# Patient Record
Sex: Female | Born: 1970 | Race: Black or African American | Hispanic: No | Smoking: Never smoker
Health system: Southern US, Community
[De-identification: ages and names within clinical notes are randomized; demographics above are authoritative.]

## PROBLEM LIST (undated history)

## (undated) DIAGNOSIS — D219 Benign neoplasm of connective and other soft tissue, unspecified: Secondary | ICD-10-CM

## (undated) DIAGNOSIS — D62 Acute posthemorrhagic anemia: Secondary | ICD-10-CM

## (undated) DIAGNOSIS — D509 Iron deficiency anemia, unspecified: Secondary | ICD-10-CM

## (undated) DIAGNOSIS — N938 Other specified abnormal uterine and vaginal bleeding: Secondary | ICD-10-CM

---

## 2020-11-16 ENCOUNTER — Encounter (HOSPITAL_COMMUNITY): Payer: Self-pay

## 2020-11-16 ENCOUNTER — Other Ambulatory Visit: Payer: Self-pay

## 2020-11-16 ENCOUNTER — Inpatient Hospital Stay (HOSPITAL_COMMUNITY)
Admission: EM | Admit: 2020-11-16 | Discharge: 2020-11-18 | DRG: 760 | Disposition: A | Discharge status: Home / Self Care | Attending: Internal Medicine | Admitting: Internal Medicine

## 2020-11-16 DIAGNOSIS — Z20822 Contact with and (suspected) exposure to covid-19: Secondary | ICD-10-CM | POA: Diagnosis not present

## 2020-11-16 DIAGNOSIS — R1900 Intra-abdominal and pelvic swelling, mass and lump, unspecified site: Secondary | ICD-10-CM

## 2020-11-16 DIAGNOSIS — D62 Acute posthemorrhagic anemia: Secondary | ICD-10-CM | POA: Diagnosis present

## 2020-11-16 DIAGNOSIS — R112 Nausea with vomiting, unspecified: Secondary | ICD-10-CM

## 2020-11-16 DIAGNOSIS — R42 Dizziness and giddiness: Secondary | ICD-10-CM | POA: Diagnosis present

## 2020-11-16 DIAGNOSIS — N920 Excessive and frequent menstruation with regular cycle: Secondary | ICD-10-CM | POA: Diagnosis present

## 2020-11-16 DIAGNOSIS — N938 Other specified abnormal uterine and vaginal bleeding: Secondary | ICD-10-CM | POA: Diagnosis not present

## 2020-11-16 DIAGNOSIS — N83291 Other ovarian cyst, right side: Secondary | ICD-10-CM | POA: Diagnosis not present

## 2020-11-16 DIAGNOSIS — Z885 Allergy status to narcotic agent status: Secondary | ICD-10-CM | POA: Diagnosis not present

## 2020-11-16 DIAGNOSIS — I959 Hypotension, unspecified: Secondary | ICD-10-CM | POA: Diagnosis not present

## 2020-11-16 DIAGNOSIS — D219 Benign neoplasm of connective and other soft tissue, unspecified: Secondary | ICD-10-CM | POA: Diagnosis present

## 2020-11-16 DIAGNOSIS — Z531 Procedure and treatment not carried out because of patient's decision for reasons of belief and group pressure: Secondary | ICD-10-CM

## 2020-11-16 DIAGNOSIS — Z886 Allergy status to analgesic agent status: Secondary | ICD-10-CM | POA: Diagnosis not present

## 2020-11-16 DIAGNOSIS — D259 Leiomyoma of uterus, unspecified: Secondary | ICD-10-CM | POA: Diagnosis not present

## 2020-11-16 DIAGNOSIS — N939 Abnormal uterine and vaginal bleeding, unspecified: Secondary | ICD-10-CM

## 2020-11-16 HISTORY — DX: Acute posthemorrhagic anemia: D62

## 2020-11-16 HISTORY — DX: Benign neoplasm of connective and other soft tissue, unspecified: D21.9

## 2020-11-16 HISTORY — DX: Other specified abnormal uterine and vaginal bleeding: N93.8

## 2020-11-16 HISTORY — DX: Iron deficiency anemia, unspecified: D50.9

## 2020-11-16 NOTE — ED Triage Notes (Signed)
Pt BIB EMS. Pt was diagnosed wit uterine fibroids a couple of months ago. Pt is having heavy bleeding and feeling weak.

## 2020-11-16 NOTE — ED Notes (Signed)
Blood draw attempted x 2 without success

## 2020-11-16 NOTE — ED Notes (Signed)
Pt emesis episode x3 while In consult room while staff attempted to draw blood.

## 2020-11-17 ENCOUNTER — Emergency Department (HOSPITAL_COMMUNITY)

## 2020-11-17 ENCOUNTER — Encounter (HOSPITAL_COMMUNITY): Payer: Self-pay | Admitting: Emergency Medicine

## 2020-11-17 ENCOUNTER — Other Ambulatory Visit: Payer: Self-pay | Admitting: Obstetrics and Gynecology

## 2020-11-17 DIAGNOSIS — N83291 Other ovarian cyst, right side: Secondary | ICD-10-CM | POA: Diagnosis present

## 2020-11-17 DIAGNOSIS — I959 Hypotension, unspecified: Secondary | ICD-10-CM

## 2020-11-17 DIAGNOSIS — N920 Excessive and frequent menstruation with regular cycle: Secondary | ICD-10-CM | POA: Diagnosis present

## 2020-11-17 DIAGNOSIS — N938 Other specified abnormal uterine and vaginal bleeding: Secondary | ICD-10-CM | POA: Diagnosis present

## 2020-11-17 DIAGNOSIS — Z885 Allergy status to narcotic agent status: Secondary | ICD-10-CM | POA: Diagnosis not present

## 2020-11-17 DIAGNOSIS — D219 Benign neoplasm of connective and other soft tissue, unspecified: Secondary | ICD-10-CM | POA: Diagnosis not present

## 2020-11-17 DIAGNOSIS — R112 Nausea with vomiting, unspecified: Secondary | ICD-10-CM

## 2020-11-17 DIAGNOSIS — Z531 Procedure and treatment not carried out because of patient's decision for reasons of belief and group pressure: Secondary | ICD-10-CM | POA: Diagnosis present

## 2020-11-17 DIAGNOSIS — D62 Acute posthemorrhagic anemia: Secondary | ICD-10-CM | POA: Diagnosis not present

## 2020-11-17 DIAGNOSIS — IMO0001 Reserved for inherently not codable concepts without codable children: Secondary | ICD-10-CM

## 2020-11-17 DIAGNOSIS — N939 Abnormal uterine and vaginal bleeding, unspecified: Secondary | ICD-10-CM | POA: Diagnosis not present

## 2020-11-17 DIAGNOSIS — Z20822 Contact with and (suspected) exposure to covid-19: Secondary | ICD-10-CM | POA: Diagnosis present

## 2020-11-17 DIAGNOSIS — R42 Dizziness and giddiness: Secondary | ICD-10-CM | POA: Diagnosis present

## 2020-11-17 DIAGNOSIS — Z886 Allergy status to analgesic agent status: Secondary | ICD-10-CM | POA: Diagnosis not present

## 2020-11-17 DIAGNOSIS — D259 Leiomyoma of uterus, unspecified: Secondary | ICD-10-CM | POA: Diagnosis present

## 2020-11-17 LAB — COMPREHENSIVE METABOLIC PANEL
ALT: 12 U/L (ref 0–44)
AST: 15 U/L (ref 15–41)
Albumin: 3.4 g/dL — ABNORMAL LOW (ref 3.5–5.0)
Alkaline Phosphatase: 35 U/L — ABNORMAL LOW (ref 38–126)
Anion gap: 8 (ref 5–15)
BUN: 9 mg/dL (ref 6–20)
CO2: 24 mmol/L (ref 22–32)
Calcium: 8 mg/dL — ABNORMAL LOW (ref 8.9–10.3)
Chloride: 107 mmol/L (ref 98–111)
Creatinine, Ser: 0.82 mg/dL (ref 0.44–1.00)
GFR, Estimated: >60 mL/min (ref >60)
Glucose, Bld: 124 mg/dL — ABNORMAL HIGH (ref 70–99)
Potassium: 3.7 mmol/L (ref 3.5–5.1)
Sodium: 139 mmol/L (ref 135–145)
Total Bilirubin: 0.4 mg/dL (ref 0.3–1.2)
Total Protein: 6.4 g/dL — ABNORMAL LOW (ref 6.5–8.1)

## 2020-11-17 LAB — CBC WITH DIFFERENTIAL/PLATELET
Abs Immature Granulocytes: 0.08 10*3/uL — ABNORMAL HIGH (ref 0.00–0.07)
Basophils Absolute: 0.1 10*3/uL (ref 0.0–0.1)
Basophils Relative: 0 %
Eosinophils Absolute: 0 10*3/uL (ref 0.0–0.5)
Eosinophils Relative: 0 %
HCT: 28.1 % — ABNORMAL LOW (ref 36.0–46.0)
Hemoglobin: 8.8 g/dL — ABNORMAL LOW (ref 12.0–15.0)
Immature Granulocytes: 1 %
Lymphocytes Relative: 9 %
Lymphs Abs: 1 10*3/uL (ref 0.7–4.0)
MCH: 30.6 pg (ref 26.0–34.0)
MCHC: 31.3 g/dL (ref 30.0–36.0)
MCV: 97.6 fL (ref 80.0–100.0)
Monocytes Absolute: 0.5 10*3/uL (ref 0.1–1.0)
Monocytes Relative: 4 %
Neutro Abs: 10.1 10*3/uL — ABNORMAL HIGH (ref 1.7–7.7)
Neutrophils Relative %: 86 %
Platelets: 347 10*3/uL (ref 150–400)
RBC: 2.88 MIL/uL — ABNORMAL LOW (ref 3.87–5.11)
RDW: 12.8 % (ref 11.5–15.5)
WBC: 11.7 10*3/uL — ABNORMAL HIGH (ref 4.0–10.5)
nRBC: 0 % (ref 0.0–0.2)

## 2020-11-17 LAB — TYPE AND SCREEN
ABO/RH(D): B POS
Antibody Screen: NEGATIVE

## 2020-11-17 LAB — RETICULOCYTES
Immature Retic Fract: 31.7 % — ABNORMAL HIGH (ref 2.3–15.9)
RBC.: 2.2 MIL/uL — ABNORMAL LOW (ref 3.87–5.11)
Retic Count, Absolute: 43.1 10*3/uL (ref 19.0–186.0)
Retic Ct Pct: 2 % (ref 0.4–3.1)

## 2020-11-17 LAB — IRON AND TIBC
Iron: 13 ug/dL — ABNORMAL LOW (ref 28–170)
Saturation Ratios: 5 % — ABNORMAL LOW (ref 10.4–31.8)
TIBC: 288 ug/dL (ref 250–450)
UIBC: 275 ug/dL

## 2020-11-17 LAB — BASIC METABOLIC PANEL
Anion gap: 7 (ref 5–15)
BUN: 8 mg/dL (ref 6–20)
CO2: 21 mmol/L — ABNORMAL LOW (ref 22–32)
Calcium: 7.3 mg/dL — ABNORMAL LOW (ref 8.9–10.3)
Chloride: 110 mmol/L (ref 98–111)
Creatinine, Ser: 0.54 mg/dL (ref 0.44–1.00)
GFR, Estimated: >60 mL/min (ref >60)
Glucose, Bld: 105 mg/dL — ABNORMAL HIGH (ref 70–99)
Potassium: 3.6 mmol/L (ref 3.5–5.1)
Sodium: 138 mmol/L (ref 135–145)

## 2020-11-17 LAB — I-STAT CHEM 8, ED
BUN: 7 mg/dL (ref 6–20)
Calcium, Ion: 1.06 mmol/L — ABNORMAL LOW (ref 1.15–1.40)
Chloride: 104 mmol/L (ref 98–111)
Creatinine, Ser: 0.8 mg/dL (ref 0.44–1.00)
Glucose, Bld: 119 mg/dL — ABNORMAL HIGH (ref 70–99)
HCT: 28 % — ABNORMAL LOW (ref 36.0–46.0)
Hemoglobin: 9.5 g/dL — ABNORMAL LOW (ref 12.0–15.0)
Potassium: 3.7 mmol/L (ref 3.5–5.1)
Sodium: 139 mmol/L (ref 135–145)
TCO2: 23 mmol/L (ref 22–32)

## 2020-11-17 LAB — HEMOGLOBIN AND HEMATOCRIT, BLOOD
HCT: 21.6 % — ABNORMAL LOW (ref 36.0–46.0)
HCT: 19.9 % — ABNORMAL LOW (ref 36.0–46.0)
Hemoglobin: 6.9 g/dL — CL (ref 12.0–15.0)
Hemoglobin: 6.2 g/dL — CL (ref 12.0–15.0)

## 2020-11-17 LAB — RESP PANEL BY RT-PCR (FLU A&B, COVID) ARPGX2
Influenza A by PCR: NEGATIVE
Influenza B by PCR: NEGATIVE
SARS Coronavirus 2 by RT PCR: NEGATIVE

## 2020-11-17 LAB — FERRITIN: Ferritin: 8 ng/mL — ABNORMAL LOW (ref 11–307)

## 2020-11-17 LAB — ABO/RH: ABO/RH(D): B POS

## 2020-11-17 LAB — HIV ANTIBODY (ROUTINE TESTING W REFLEX): HIV Screen 4th Generation wRfx: NONREACTIVE

## 2020-11-17 LAB — I-STAT BETA HCG BLOOD, ED (MC, WL, AP ONLY): I-stat hCG, quantitative: <5 m[IU]/mL (ref <5)

## 2020-11-17 MED ORDER — ACETAMINOPHEN 650 MG RE SUPP: 650.0000 mg | Freq: Four times a day (QID) | RECTAL | Status: DC | PRN

## 2020-11-17 MED ORDER — LACTATED RINGERS IV SOLN: INTRAVENOUS | Status: DC

## 2020-11-17 MED ORDER — SODIUM CHLORIDE 0.9 % IV BOLUS
1000.0000 mL | Freq: Once | INTRAVENOUS | Status: AC
  Administered 2020-11-17: 1000 mL via INTRAVENOUS

## 2020-11-17 MED ORDER — LEUPROLIDE ACETATE (3 MONTH) 11.25 MG IM KIT: 11.2500 mg | PACK | Freq: Once | INTRAMUSCULAR | Status: DC

## 2020-11-17 MED ORDER — TRANEXAMIC ACID-NACL 1000-0.7 MG/100ML-% IV SOLN
1000.0000 mg | INTRAVENOUS | Status: AC
  Administered 2020-11-17: 1000 mg via INTRAVENOUS
  Filled 2020-11-17: qty 100

## 2020-11-17 MED ORDER — MEGESTROL ACETATE 40 MG PO TABS
120.0000 mg | ORAL_TABLET | Freq: Every day | ORAL | Status: DC
  Administered 2020-11-17 – 2020-11-18 (×2): 120 mg via ORAL
  Filled 2020-11-17 (×3): qty 3

## 2020-11-17 MED ORDER — FERROUS SULFATE 325 (65 FE) MG PO TABS
325.0000 mg | ORAL_TABLET | Freq: Three times a day (TID) | ORAL | Status: DC
  Administered 2020-11-17 – 2020-11-18 (×2): 325 mg via ORAL
  Filled 2020-11-17 (×2): qty 1

## 2020-11-17 MED ORDER — ACETAMINOPHEN 325 MG PO TABS: 650.0000 mg | ORAL_TABLET | Freq: Four times a day (QID) | ORAL | Status: DC | PRN

## 2020-11-17 MED ORDER — ONDANSETRON HCL 4 MG/2ML IJ SOLN: 4.0000 mg | Freq: Four times a day (QID) | INTRAMUSCULAR | Status: DC | PRN

## 2020-11-17 MED ORDER — LEUPROLIDE ACETATE 3.75 MG IM KIT
3.7500 mg | PACK | Freq: Once | INTRAMUSCULAR | Status: AC
  Administered 2020-11-17: 3.75 mg via INTRAMUSCULAR
  Filled 2020-11-17: qty 3.75

## 2020-11-17 MED ORDER — ONDANSETRON HCL 4 MG PO TABS: 4.0000 mg | ORAL_TABLET | Freq: Four times a day (QID) | ORAL | Status: DC | PRN

## 2020-11-17 MED ORDER — ESTROGENS CONJUGATED 25 MG IJ SOLR
25.0000 mg | INTRAMUSCULAR | Status: AC
  Administered 2020-11-17: 25 mg via INTRAVENOUS
  Filled 2020-11-17: qty 25

## 2020-11-17 NOTE — ED Notes (Signed)
Patient is alert and oriented when checked frequently.  No distress noted.  Patient is on her cell phone

## 2020-11-17 NOTE — ED Notes (Signed)
Ria Bush, RN was sent a secure message in regards to bed admission assignment [id number].  Currently the bed is dirty

## 2020-11-17 NOTE — H&P (Signed)
History and Physical    Danielle Hoffman [id number] DOB: 07/29/1970 DOA: 11/16/2020  PCP: Pcp, No  Patient coming from: Home  I have personally briefly reviewed patient's old medical records in [place]  Chief Complaint: Vaginal bleeding, weakness  HPI: Danielle Hoffman is a 50 y.o. female with medical history significant of DUB, iron def anemia, fibroids.  Pt with HGB as low as 5.x in March this year.  Pt is a Jehova's witness and refuses blood products.  Heme onc at [facility] treated pt with IV iron infusion and erythropoetin.  Pt hasnt followed up with OB/GYN as recommended.  Pt in the area because her daughter just had a baby.  Pt presents to ED with c/o 1 day h/o vaginal bleeding and weakness.  Symptoms onset suddenly.  Weakness is generalized.  Symptoms constant, nothing makes better or worse.  No fever, does have N/V.  ED Course: Pt with initial hypotension.  Given 2L of NS.  Initial HGB 8.5.  Pt on no blood thinners.  Pt confirms she is Jehova's witness and refuses blood products.  Pt is okay with: 1) IV iron 2) TXA 3) erythropoetin 4) IV fluid  Pt given TXA, IV premarin in ED.   Review of Systems: As per HPI, otherwise all review of systems negative.  Past Medical History:  Diagnosis Date   ABLA (acute blood loss anemia)    Dysfunctional uterine bleeding    Fibroids    Iron deficiency anemia     History reviewed. No pertinent surgical history.   reports that she has never smoked. She has never used smokeless tobacco. She reports current alcohol use. She reports that she does not use drugs.  Allergies  Allergen Reactions   Aspirin     Doesn't take due to bleeding.   Hydrocodone     Family History  Problem Relation Age of Onset   Bleeding Disorder Neg Hx      Prior to Admission medications   Not on File    Physical Exam: Vitals:   11/17/20 0230 11/17/20 0300 11/17/20 0330 11/17/20 0400  BP: 123/64 119/74 128/74 125/75  Pulse: 82 81 87 85  Resp:  '18 17 16 16  '$ Temp:      TempSrc:      SpO2: 100% 100% 100% 99%  Height:        Constitutional: NAD, calm, comfortable, Pale and ill appearing Eyes: PERRL, lids and conjunctivae normal ENMT: Mucous membranes are moist. Posterior pharynx clear of any exudate or lesions.Normal dentition.  Neck: normal, supple, no masses, no thyromegaly Respiratory: clear to auscultation bilaterally, no wheezing, no crackles. Normal respiratory effort. No accessory muscle use.  Cardiovascular: Regular rate and rhythm, no murmurs / rubs / gallops. No extremity edema. 2+ pedal pulses. No carotid bruits.  Abdomen: no tenderness, no masses palpated. No hepatosplenomegaly. Bowel sounds positive.  Musculoskeletal: no clubbing / cyanosis. No joint deformity upper and lower extremities. Good ROM, no contractures. Normal muscle tone.  Skin: no rashes, lesions, ulcers. No induration Neurologic: CN 2-12 grossly intact. Sensation intact, DTR normal. Strength 5/5 in all 4.  Psychiatric: Normal judgment and insight. Alert and oriented x 3. Normal mood.    Labs on Admission: I have personally reviewed following labs and imaging studies  CBC: Recent Labs  Lab 11/17/20 0200 11/17/20 0209  WBC 11.7*  --   NEUTROABS 10.1*  --   HGB 8.8* 9.5*  HCT 28.1* 28.0*  MCV 97.6  --   PLT 347  --  Basic Metabolic Panel: Recent Labs  Lab 11/17/20 0200 11/17/20 0209  NA 139 139  K 3.7 3.7  CL 107 104  CO2 24  --   GLUCOSE 124* 119*  BUN 9 7  CREATININE 0.82 0.80  CALCIUM 8.0*  --    GFR: CrCl cannot be calculated (Unknown ideal weight.). Liver Function Tests: Recent Labs  Lab 11/17/20 0200  AST 15  ALT 12  ALKPHOS 35*  BILITOT 0.4  PROT 6.4*  ALBUMIN 3.4*   No results for input(s): LIPASE, AMYLASE in the last 168 hours. No results for input(s): AMMONIA in the last 168 hours. Coagulation Profile: No results for input(s): INR, PROTIME in the last 168 hours. Cardiac Enzymes: No results for input(s):  CKTOTAL, CKMB, CKMBINDEX, TROPONINI in the last 168 hours. BNP (last 3 results) No results for input(s): PROBNP in the last 8760 hours. HbA1C: No results for input(s): HGBA1C in the last 72 hours. CBG: No results for input(s): GLUCAP in the last 168 hours. Lipid Profile: No results for input(s): CHOL, HDL, LDLCALC, TRIG, CHOLHDL, LDLDIRECT in the last 72 hours. Thyroid Function Tests: No results for input(s): TSH, T4TOTAL, FREET4, T3FREE, THYROIDAB in the last 72 hours. Anemia Panel: No results for input(s): VITAMINB12, FOLATE, FERRITIN, TIBC, IRON, RETICCTPCT in the last 72 hours. Urine analysis: No results found for: COLORURINE, APPEARANCEUR, LABSPEC, [place], GLUCOSEU, HGBUR, BILIRUBINUR, KETONESUR, PROTEINUR, UROBILINOGEN, NITRITE, LEUKOCYTESUR  Radiological Exams on Admission: DG Chest 1 View  Result Date: 11/17/2020 CLINICAL DATA:  Weakness and heavy vaginal bleeding. EXAM: CHEST  1 VIEW COMPARISON:  None. FINDINGS: The heart size and mediastinal contours are within normal limits. Both lungs are clear. The visualized skeletal structures are unremarkable. IMPRESSION: No active disease. Electronically Signed   By: Virgina Norfolk M.D.   On: 11/17/2020 02:10   US PELVIS (TRANSABDOMINAL ONLY)  Result Date: 11/17/2020 CLINICAL DATA:  Heavy vaginal bleeding. EXAM: TRANSABDOMINAL ULTRASOUND OF PELVIS TECHNIQUE: Transabdominal ultrasound examination of the pelvis was performed including evaluation of the uterus, ovaries, adnexal regions, and pelvic cul-de-sac. COMPARISON:  None. FINDINGS: Uterus Measurements: 16.3 cm x 10.1 cm x 12.7 cm = volume: 1084.8 mL. A 9.2 cm x 7.8 cm x 8.9 cm heterogeneous uterine fibroid is seen extending into the expected region of the endometrial canal. Additional 4.6 cm x 3.2 cm x 3.4 cm and 3.6 cm x 2.7 cm x 3.2 cm anterior uterine fibroids are seen. Endometrium Thickness: N/A.  Not clearly visualized. Right ovary Measurements: 3.3 cm x 2.5 cm x 2.7 cm = volume:  1.4 mL. A simple right ovarian cyst is seen. Left ovary Measurements: 1.5 cm x 0.8 cm x 1.6 cm = volume: 0.9 mL. Normal appearance/no adnexal mass. Other findings:  No abnormal free fluid. IMPRESSION: 1. Multiple large heterogeneous uterine fibroids. 2. Simple right ovarian cyst. Electronically Signed   By: Virgina Norfolk M.D.   On: 11/17/2020 02:14    EKG: Independently reviewed.  Assessment/Plan Principal Problem:   ABLA (acute blood loss anemia) Active Problems:   Dysfunctional uterine bleeding   Fibroids   Refusal of blood transfusions as patient is Jehovah's Witness    ABLA - due to dysfunctional uterine bleeding with fibroids Pt confirms she is Jehova's witness and refuses PRBC transfusion.  Blood products refusal form signed by patient. OB/GYN: will see in consult TXA given in ED Premarin given in ED Megace ordered Checking repeat H/H at 0800 Checking Iron, TIBC, reticulocytes, erythropoietin labs May wish to call heme/onc in AM Consider IV iron  and erythropoietin if indicated Tele monitor  DVT prophylaxis: SCDs Code Status: Full Family Communication: Spoke with friend Audrie Lia over phone Disposition Plan: Home after stabilized from bleeding perspective Consults called: EDP spoke with OB/GYN Admission status: Admit to inpatient  Severity of Illness: The appropriate patient status for this patient is INPATIENT. Inpatient status is judged to be reasonable and necessary in order to provide the required intensity of service to ensure the patient's safety. The patient's presenting symptoms, physical exam findings, and initial radiographic and laboratory data in the context of their chronic comorbidities is felt to place them at high risk for further clinical deterioration. Furthermore, it is not anticipated that the patient will be medically stable for discharge from the hospital within 2 midnights of admission. The following factors support the patient status of inpatient.    IP status due to dysfunctional uterine bleeding, blood loss anemia, complicated by fact that patient is Jehova's witness.  * I certify that at the point of admission it is my clinical judgment that the patient will require inpatient hospital care spanning beyond 2 midnights from the point of admission due to high intensity of service, high risk for further deterioration and high frequency of surveillance required.*   Myah Guynes M. DO [facility]  How to contact the [facility] Attending or Consulting provider [place] or covering provider during after hours [place], for this patient?  Check the care team in [facility] and look for a) attending/consulting [facility] provider listed and b) the [facility] team listed Log into [email or web address]  Amion Physician Scheduling and messaging for groups and whole hospitals  On call and physician scheduling software for group practices, residents, hospitalists and other medical providers for call, clinic, rotation and shift schedules. OnCall Enterprise is a hospital-wide system for scheduling doctors and paging doctors on call. EasyPlot is for scientific plotting and data analysis.  [email or web address]  and use [place]'s universal password to access. If you do not have the password, please contact the hospital operator.  Locate the [facility] provider you are looking for under [facility] and page to a number that you can be directly reached. If you still have difficulty reaching the provider, please page the [facility] (Director on Call) for the Hospitalists listed on amion for assistance.  11/17/2020, 4:57 AM

## 2020-11-17 NOTE — ED Notes (Signed)
Dr Dwyane Dee was notified of critical lab value- hgb 6.9

## 2020-11-17 NOTE — ED Provider Notes (Signed)
[place] DEPT Provider Note   CSN: [id number] Arrival date & time: 11/16/20  2301     History Chief Complaint  Patient presents with   Weakness   Vaginal Bleeding    Danielle Hoffman is a 50 y.o. female.  The history is provided by the patient.  Weakness Severity:  Moderate Onset quality:  Gradual Duration:  1 day Timing:  Constant Progression:  Unchanged Chronicity:  New Context comment:  Vaginal bleeding Relieved by:  Nothing Worsened by:  Nothing Ineffective treatments:  None tried Associated symptoms: nausea and vomiting   Associated symptoms: no arthralgias, no numbness in extremities, no falls, no fever and no near-syncope   Risk factors: anemia   Risk factors: no heart disease   Vaginal Bleeding Quality:  Bright red Severity:  Severe Onset quality:  Sudden Duration:  1 day Timing:  Constant Progression:  Unchanged Chronicity:  Recurrent Context: spontaneously   Relieved by:  Nothing Worsened by:  Nothing Associated symptoms: nausea   Associated symptoms: no fever   Risk factors: no bleeding disorder   Patient with a h/o fibroids and anemia presents with heavy vaginal bleeding, nausea and vomiting.  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      Past Medical History:  Diagnosis Date   Fibroids     There are no problems to display for this patient.   History reviewed. No pertinent surgical history.   OB History   No obstetric history on file.     History reviewed. No pertinent family history.     Home Medications Prior to Admission medications   Not on File    Allergies    Patient has no allergy information on record.  Review of Systems   Review of Systems  Constitutional:  Negative for fever.  HENT:  Negative for facial swelling.   Eyes:  Negative for redness.  Respiratory:  Negative for wheezing.   Cardiovascular:  Negative for leg swelling and near-syncope.  Gastrointestinal:   Positive for nausea and vomiting.  Genitourinary:  Positive for vaginal bleeding.  Musculoskeletal:  Negative for arthralgias and falls.  Skin:  Negative for rash.  Neurological:  Positive for weakness.  Psychiatric/Behavioral:  Negative for agitation.   All other systems reviewed and are negative.  Physical Exam Updated Vital Signs BP (!) 82/56 (BP Location: Left Arm)   Pulse 89   Temp 97.8 F (36.6 C) (Oral)   Resp 14   Ht '5\' 5"'$  (1.651 m)   SpO2 94%   Physical Exam Vitals and nursing note reviewed.  Constitutional:      General: She is not in acute distress.    Appearance: Normal appearance.  HENT:     Head: Normocephalic and atraumatic.     Nose: Nose normal.  Eyes:     Conjunctiva/sclera: Conjunctivae normal.     Pupils: Pupils are equal, round, and reactive to light.  Cardiovascular:     Rate and Rhythm: Normal rate and regular rhythm.     Pulses: Normal pulses.     Heart sounds: Normal heart sounds.  Pulmonary:     Effort: Pulmonary effort is normal.     Breath sounds: Normal breath sounds.  Abdominal:     General: Abdomen is flat. Bowel sounds are normal.     Palpations: Abdomen is soft.     Tenderness: There is no abdominal tenderness. There is no guarding.  Musculoskeletal:        General: Normal range of motion.  Cervical back: Normal range of motion and neck supple.  Skin:    General: Skin is warm and dry.     Capillary Refill: Capillary refill takes less than 2 seconds.  Neurological:     General: No focal deficit present.     Mental Status: She is alert and oriented to person, place, and time.     Deep Tendon Reflexes: Reflexes normal.  Psychiatric:        Mood and Affect: Mood normal.        Behavior: Behavior normal.    ED Results / Procedures / Treatments   Labs (all labs ordered are listed, but only abnormal results are displayed) Results for orders placed or performed during the hospital encounter of 11/16/20  Comprehensive metabolic  panel  Result Value Ref Range   Sodium 139 135 - 145 mmol/L   Potassium 3.7 3.5 - 5.1 mmol/L   Chloride 107 98 - 111 mmol/L   CO2 24 22 - 32 mmol/L   Glucose, Bld 124 (H) 70 - 99 mg/dL   BUN 9 6 - 20 mg/dL   Creatinine, Ser 0.82 0.44 - 1.00 mg/dL   Calcium 8.0 (L) 8.9 - 10.3 mg/dL   Total Protein 6.4 (L) 6.5 - 8.1 g/dL   Albumin 3.4 (L) 3.5 - 5.0 g/dL   AST 15 15 - 41 U/L   ALT 12 0 - 44 U/L   Alkaline Phosphatase 35 (L) 38 - 126 U/L   Total Bilirubin 0.4 0.3 - 1.2 mg/dL   GFR, Estimated >60 >60 mL/min   Anion gap 8 5 - 15  I-stat chem 8, ED (not at [facility] or [facility])  Result Value Ref Range   Sodium 139 135 - 145 mmol/L   Potassium 3.7 3.5 - 5.1 mmol/L   Chloride 104 98 - 111 mmol/L   BUN 7 6 - 20 mg/dL   Creatinine, Ser 0.80 0.44 - 1.00 mg/dL   Glucose, Bld 119 (H) 70 - 99 mg/dL   Calcium, Ion 1.06 (L) 1.15 - 1.40 mmol/L   TCO2 23 22 - 32 mmol/L   Hemoglobin 9.5 (L) 12.0 - 15.0 g/dL   HCT 28.0 (L) 36.0 - 46.0 %  I-Stat Beta hCG blood, ED ([facility], [facility], [facility] only)  Result Value Ref Range   I-stat hCG, quantitative <5.0 <5 mIU/mL   Comment 3          Type and screen [place]  Result Value Ref Range   ABO/RH(D) B POS    Antibody Screen NEG    Sample Expiration      11/20/2020,2359 Performed at [facility], [place] [address] [place], [place] [address]    DG Chest 1 View  Result Date: 11/17/2020 CLINICAL DATA:  Weakness and heavy vaginal bleeding. EXAM: CHEST  1 VIEW COMPARISON:  None. FINDINGS: The heart size and mediastinal contours are within normal limits. Both lungs are clear. The visualized skeletal structures are unremarkable. IMPRESSION: No active disease. Electronically Signed   By: Virgina Norfolk M.D.   On: 11/17/2020 02:10   US PELVIS (TRANSABDOMINAL ONLY)  Result Date: 11/17/2020 CLINICAL DATA:  Heavy vaginal bleeding. EXAM: TRANSABDOMINAL ULTRASOUND OF PELVIS TECHNIQUE: Transabdominal ultrasound examination of the pelvis was  performed including evaluation of the uterus, ovaries, adnexal regions, and pelvic cul-de-sac. COMPARISON:  None. FINDINGS: Uterus Measurements: 16.3 cm x 10.1 cm x 12.7 cm = volume: 1084.8 mL. A 9.2 cm x 7.8 cm x 8.9 cm heterogeneous uterine fibroid is seen  extending into the expected region of the endometrial canal. Additional 4.6 cm x 3.2 cm x 3.4 cm and 3.6 cm x 2.7 cm x 3.2 cm anterior uterine fibroids are seen. Endometrium Thickness: N/A.  Not clearly visualized. Right ovary Measurements: 3.3 cm x 2.5 cm x 2.7 cm = volume: 1.4 mL. A simple right ovarian cyst is seen. Left ovary Measurements: 1.5 cm x 0.8 cm x 1.6 cm = volume: 0.9 mL. Normal appearance/no adnexal mass. Other findings:  No abnormal free fluid. IMPRESSION: 1. Multiple large heterogeneous uterine fibroids. 2. Simple right ovarian cyst. Electronically Signed   By: Virgina Norfolk M.D.   On: 11/17/2020 02:14     Radiology No results found.  Procedures Procedures   Medications Ordered in ED Medications  megestrol (MEGACE) tablet 120 mg (has no administration in time range)  tranexamic acid (CYKLOKAPRON) IVPB 1,000 mg (1,000 mg Intravenous New Bag/Given 11/17/20 0312)  sodium chloride 0.9 % bolus 1,000 mL (0 mLs Intravenous Stopped 11/17/20 0200)  sodium chloride 0.9 % bolus 1,000 mL (1,000 mLs Intravenous New Bag/Given 11/17/20 0311)  conjugated estrogens (PREMARIN) injection 25 mg (25 mg Intravenous Given 11/17/20 0300)     ED Course  I have reviewed the triage vital signs and the nursing notes.  Pertinent labs & imaging results that were available during my care of the patient were reviewed by me and considered in my medical decision making (see chart for details).  BP improved post IVF.    Case d/w Dr. Elonda Husky, IV premarin, IV TXA, 120 mg oral megace.  Repeat CBCs, GYN will consult    Final Clinical Impression(s) / ED Diagnoses Final diagnoses:  None   Admit to medicine     Donnella Morford, MD 11/17/20 [id number]

## 2020-11-17 NOTE — ED Notes (Signed)
Chuck pad placed under pt due pt soaking through pants with blood

## 2020-11-17 NOTE — Progress Notes (Signed)
PIV consult for labs. Met with pt, she reports current IV feels OK. Infusing. Message to RN requesting she contact phlebotomy for lab draw. There is no guarantee a new PIV will draw labs.

## 2020-11-17 NOTE — Consult Note (Signed)
OB/GYN Consult Note  Referring Provider: Shawna Clamp  Danielle Hoffman is a 51 y.o. G1P1, SVD x 1 presenting for dysfunctional uterine bleeding/ menorrhagia admitted for symptomatic anemia secondary to fibroid uterus. Gyn consulted for discussion of treatment options and recommendations.  The patient notes she has regular menses lasting 5 days with the heaviest bleeding on day 3 of her cycle.  She notes she does pass clots during this time.  Pt has had chronically low h/h since February/March 2022 when this heavy bleeding started.  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  She has had hemoglobin as low as 5.3.  the patient has had EPO stimulating agents and iron replacement (infusions) in the past.      Past Medical History:  Diagnosis Date   ABLA (acute blood loss anemia)    Dysfunctional uterine bleeding    Fibroids    Iron deficiency anemia     History reviewed. No pertinent surgical history.  OB History  Gravida Para Term Preterm AB Living  '1 1 1     1  '$ SAB IAB Ectopic Multiple Live Births          1    # Outcome Date GA Lbr Len/2nd Weight Sex Delivery Anes PTL Lv  1 Term      Vag-Spont       Social History   Socioeconomic History   Marital status: Widowed    Spouse name: Not on file   Number of children: Not on file   Years of education: Not on file   Highest education level: Not on file  Occupational History   Not on file  Tobacco Use   Smoking status: Never   Smokeless tobacco: Never  Substance and Sexual Activity   Alcohol use: Yes    Comment: occ   Drug use: Never   Sexual activity: Not on file  Other Topics Concern   Not on file  Social History Narrative   Not on file   Social Determinants of Health   Financial Resource Strain: Not on file  Food Insecurity: Not on file  Transportation Needs: Not on file  Physical Activity: Not on file  Stress: Not on file  Social Connections: Not on file     Family History  Problem Relation Age of Onset   Bleeding Disorder Neg Hx     Medications Prior to Admission  Medication Sig Dispense Refill Last Dose   Ascorbic Acid (VITAMIN C PO) Take 1 tablet by mouth daily.   Past Week   Cyanocobalamin (B-12 PO) Take 1 tablet by mouth daily.   Past Week   ferrous sulfate 325 (65 FE) MG tablet Take 325 mg by mouth daily.   Past Week   ibuprofen (ADVIL) 200 MG tablet Take 200 mg by mouth every 6 (six) hours as needed for moderate pain or mild pain.   Past Month    Allergies  Allergen Reactions   Aspirin Other (See Comments)    Doesn't take due to bleeding.   Hydrocodone Other (See Comments)    Per patient, makes her feel "high" and doesn't like it    Review of Systems: Negative except for what is mentioned in HPI.     Physical Exam: BP 133/69 (BP Location: Left Arm)   Pulse (!) 106   Temp 98.1 F (36.7 C)   Resp (!) 22   Ht '5\' 5"'$  (1.651 m)   LMP  (LMP  Unknown)   SpO2 100%  CONSTITUTIONAL: Slim almost cachetic appearing female in no acute distress. Pt does appear fatigued and tired. HENT:  Normocephalic, atraumatic, External right and left ear normal.  EYES: Conjunctivae and EOM are normal. NECK: Normal range of motion, supple, no masses SKIN: Skin is warm and dry. No rash noted. Not diaphoretic. No erythema. No pallor. [place]: Alert and oriented to person, place, and time. Normal reflexes, muscle tone coordination. No cranial nerve deficit noted. PSYCHIATRIC: Normal mood and affect. Normal behavior. Normal judgment and thought content. CARDIOVASCULAR: Nmild tachycardia noted, regular rhythm RESPIRATORY: Effort and breath sounds normal, no problems with respiration noted ABDOMEN: Soft, mildly tender, nondistended, firm near umbilicus and RLQ PELVIC: deferred MUSCULOSKELETAL: Normal range of motion. No edema and no tenderness. 2+ distal pulses.  CLINICAL DATA:  Heavy vaginal bleeding.   EXAM: TRANSABDOMINAL ULTRASOUND OF  PELVIS   TECHNIQUE: Transabdominal ultrasound examination of the pelvis was performed including evaluation of the uterus, ovaries, adnexal regions, and pelvic cul-de-sac.   COMPARISON:  None.   FINDINGS: Uterus   Measurements: 16.3 cm x 10.1 cm x 12.7 cm = volume: 1084.8 mL. A 9.2 cm x 7.8 cm x 8.9 cm heterogeneous uterine fibroid is seen extending into the expected region of the endometrial canal. Additional 4.6 cm x 3.2 cm x 3.4 cm and 3.6 cm x 2.7 cm x 3.2 cm anterior uterine fibroids are seen.   Endometrium   Thickness: N/A.  Not clearly visualized.   Right ovary   Measurements: 3.3 cm x 2.5 cm x 2.7 cm = volume: 1.4 mL. A simple right ovarian cyst is seen.   Left ovary   Measurements: 1.5 cm x 0.8 cm x 1.6 cm = volume: 0.9 mL. Normal appearance/no adnexal mass.   Other findings:  No abnormal free fluid.   IMPRESSION: 1. Multiple large heterogeneous uterine fibroids. 2. Simple right ovarian cyst.  Pertinent Labs/Studies:   Results for orders placed or performed during the hospital encounter of 11/16/20 (from the past 72 hour(s))  Type and screen [place]     Status: None   Collection Time: 11/17/20  2:00 AM  Result Value Ref Range   ABO/RH(D) B POS    Antibody Screen NEG    Sample Expiration      11/20/2020,2359 Performed at [facility] [address], [place], [place] [address]   Comprehensive metabolic panel     Status: Abnormal   Collection Time: 11/17/20  2:00 AM  Result Value Ref Range   Sodium 139 135 - 145 mmol/L   Potassium 3.7 3.5 - 5.1 mmol/L   Chloride 107 98 - 111 mmol/L   CO2 24 22 - 32 mmol/L   Glucose, Bld 124 (H) 70 - 99 mg/dL    Comment: Glucose reference range applies only to samples taken after fasting for at least 8 hours.   BUN 9 6 - 20 mg/dL   Creatinine, Ser 0.82 0.44 - 1.00 mg/dL   Calcium 8.0 (L) 8.9 - 10.3 mg/dL   Total Protein 6.4 (L) 6.5 - 8.1 g/dL   Albumin 3.4 (L) 3.5 - 5.0 g/dL    AST 15 15 - 41 U/L   ALT 12 0 - 44 U/L   Alkaline Phosphatase 35 (L) 38 - 126 U/L   Total Bilirubin 0.4 0.3 - 1.2 mg/dL   GFR, Estimated >60 >60 mL/min    Comment: (NOTE) Calculated using the CKD-EPI Creatinine Equation (2021)    Anion gap 8  5 - 15    Comment: Performed at [facility], [place] [address], [place], [place] [address]  CBC with Differential/Platelet     Status: Abnormal   Collection Time: 11/17/20  2:00 AM  Result Value Ref Range   WBC 11.7 (H) 4.0 - 10.5 K/uL   RBC 2.88 (L) 3.87 - 5.11 MIL/uL   Hemoglobin 8.8 (L) 12.0 - 15.0 g/dL   HCT 28.1 (L) 36.0 - 46.0 %   MCV 97.6 80.0 - 100.0 fL   MCH 30.6 26.0 - 34.0 pg   MCHC 31.3 30.0 - 36.0 g/dL   RDW 12.8 11.5 - 15.5 %   Platelets 347 150 - 400 K/uL   nRBC 0.0 0.0 - 0.2 %   Neutrophils Relative % 86 %   Neutro Abs 10.1 (H) 1.7 - 7.7 K/uL   Lymphocytes Relative 9 %   Lymphs Abs 1.0 0.7 - 4.0 K/uL   Monocytes Relative 4 %   Monocytes Absolute 0.5 0.1 - 1.0 K/uL   Eosinophils Relative 0 %   Eosinophils Absolute 0.0 0.0 - 0.5 K/uL   Basophils Relative 0 %   Basophils Absolute 0.1 0.0 - 0.1 K/uL   Immature Granulocytes 1 %   Abs Immature Granulocytes 0.08 (H) 0.00 - 0.07 K/uL    Comment: Performed at [facility], [place] [address] [place], [place] [address]  I-Stat Beta hCG blood, ED ([facility], [facility], [facility] only)     Status: None   Collection Time: 11/17/20  2:07 AM  Result Value Ref Range   I-stat hCG, quantitative <5.0 <5 mIU/mL   Comment 3            Comment:   GEST. AGE      CONC.  (mIU/mL)   <=1 WEEK        5 - 50     2 WEEKS       50 - 500     3 WEEKS       100 - 10,000     4 WEEKS     1,000 - 30,000        FEMALE AND NON-PREGNANT FEMALE:     LESS THAN 5 mIU/mL   I-stat chem 8, ED (not at [facility] or [facility])     Status: Abnormal   Collection Time: 11/17/20  2:09 AM  Result Value Ref Range   Sodium 139 135 - 145 mmol/L   Potassium 3.7 3.5 - 5.1 mmol/L   Chloride 104 98 - 111 mmol/L    BUN 7 6 - 20 mg/dL   Creatinine, Ser 0.80 0.44 - 1.00 mg/dL   Glucose, Bld 119 (H) 70 - 99 mg/dL    Comment: Glucose reference range applies only to samples taken after fasting for at least 8 hours.   Calcium, Ion 1.06 (L) 1.15 - 1.40 mmol/L   TCO2 23 22 - 32 mmol/L   Hemoglobin 9.5 (L) 12.0 - 15.0 g/dL   HCT 28.0 (L) 36.0 - 46.0 %  Resp Panel by RT-PCR (Flu A&B, Covid) Nasopharyngeal Swab     Status: None   Collection Time: 11/17/20  3:58 AM   Specimen: Nasopharyngeal Swab; Nasopharyngeal(NP) swabs in vial transport medium  Result Value Ref Range   SARS Coronavirus 2 by RT PCR NEGATIVE NEGATIVE    Comment: (NOTE) SARS-CoV-2 target nucleic acids are NOT DETECTED.  The SARS-CoV-2 RNA is generally detectable in upper respiratory specimens during the acute phase of infection. The lowest concentration of SARS-CoV-2  viral copies this assay can detect is 138 copies/mL. A negative result does not preclude SARS-Cov-2 infection and should not be used as the sole basis for treatment or other patient management decisions. A negative result may occur with  improper specimen collection/handling, submission of specimen other than nasopharyngeal swab, presence of viral mutation(s) within the areas targeted by this assay, and inadequate number of viral copies(<138 copies/mL). A negative result must be combined with clinical observations, patient history, and epidemiological information. The expected result is Negative.  Fact Sheet for Patients:  EntrepreneurPulse.com.au  Fact Sheet for Healthcare Providers:  IncredibleEmployment.be  This test is no t yet approved or cleared by the [place] FDA and  has been authorized for detection and/or diagnosis of SARS-CoV-2 by FDA under an Emergency Use Authorization (EUA). This EUA will remain  in effect (meaning this test can be used) for the duration of the COVID-19 declaration under Section 564(b)(1) of the Act,  21 U.S.C.section 360bbb-3(b)(1), unless the authorization is terminated  or revoked sooner.       Influenza A by PCR NEGATIVE NEGATIVE   Influenza B by PCR NEGATIVE NEGATIVE    Comment: (NOTE) The Xpert Xpress SARS-CoV-2/FLU/RSV plus assay is intended as an aid in the diagnosis of influenza from Nasopharyngeal swab specimens and should not be used as a sole basis for treatment. Nasal washings and aspirates are unacceptable for Xpert Xpress SARS-CoV-2/FLU/RSV testing.  Fact Sheet for Patients: EntrepreneurPulse.com.au  Fact Sheet for Healthcare Providers: IncredibleEmployment.be  This test is not yet approved or cleared by the [place] FDA and has been authorized for detection and/or diagnosis of SARS-CoV-2 by FDA under an Emergency Use Authorization (EUA). This EUA will remain in effect (meaning this test can be used) for the duration of the COVID-19 declaration under Section 564(b)(1) of the Act, 21 U.S.C. section 360bbb-3(b)(1), unless the authorization is terminated or revoked.  Performed at [facility], [place] [address] [place], [place] [address]   HIV Antibody (routine testing w rflx)     Status: None   Collection Time: 11/17/20  6:47 AM  Result Value Ref Range   HIV Screen 4th Generation wRfx Non Reactive Non Reactive    Comment: Performed at [facility], [place] [address] [place], [place] [id number]  Basic metabolic panel     Status: Abnormal   Collection Time: 11/17/20  6:48 AM  Result Value Ref Range   Sodium 138 135 - 145 mmol/L   Potassium 3.6 3.5 - 5.1 mmol/L   Chloride 110 98 - 111 mmol/L   CO2 21 (L) 22 - 32 mmol/L   Glucose, Bld 105 (H) 70 - 99 mg/dL    Comment: Glucose reference range applies only to samples taken after fasting for at least 8 hours.   BUN 8 6 - 20 mg/dL   Creatinine, Ser 0.54 0.44 - 1.00 mg/dL   Calcium 7.3 (L) 8.9 - 10.3 mg/dL   GFR, Estimated >60 >60 mL/min    Comment:  (NOTE) Calculated using the CKD-EPI Creatinine Equation (2021)    Anion gap 7 5 - 15    Comment: Performed at [facility], [place] [address] [place], [place] [address]  Iron and TIBC     Status: Abnormal   Collection Time: 11/17/20  6:48 AM  Result Value Ref Range   Iron 13 (L) 28 - 170 ug/dL   TIBC 288 250 - 450 ug/dL   Saturation Ratios 5 (L) 10.4 - 31.8 %  UIBC 275 ug/dL    Comment: Performed at [facility], [place] [address], [place], [place] [address]  Reticulocytes     Status: Abnormal   Collection Time: 11/17/20  6:48 AM  Result Value Ref Range   Retic Ct Pct 2.0 0.4 - 3.1 %   RBC. 2.20 (L) 3.87 - 5.11 MIL/uL   Retic Count, Absolute 43.1 19.0 - 186.0 K/uL   Immature Retic Fract 31.7 (H) 2.3 - 15.9 %    Comment: Performed at [facility], [place] [address], [place], [place] [address]  Hemoglobin and hematocrit, blood     Status: Abnormal   Collection Time: 11/17/20  8:00 AM  Result Value Ref Range   Hemoglobin 6.9 (LL) 12.0 - 15.0 g/dL    Comment: REPEATED TO VERIFY THIS CRITICAL RESULT HAS VERIFIED AND BEEN CALLED TO MAS[place],C. RN BY NICOLE MCCOY ON 07 26 2022 AT 0900, AND HAS BEEN READ BACK. CRITICAL RESULT VERIFIED. RESULT CHECKED.    HCT 21.6 (L) 36.0 - 46.0 %    Comment: Performed at [facility], [place] [address], [place], [place] [address]  Hemoglobin and hematocrit, blood     Status: Abnormal   Collection Time: 11/17/20  4:06 PM  Result Value Ref Range   Hemoglobin 6.2 (LL) 12.0 - 15.0 g/dL    Comment: CRITICAL VALUE NOTED.  VALUE IS CONSISTENT WITH PREVIOUSLY REPORTED AND CALLED VALUE.   HCT 19.9 (L) 36.0 - 46.0 %    Comment: Performed at [facility], [place] [address] [place], [place] [address]  Ferritin     Status: Abnormal   Collection Time: 11/17/20  4:06 PM  Result Value Ref Range   Ferritin 8 (L) 11 - 307 ng/mL    Comment: Performed at [facility], [place] [address] [place],  [address]       Assessment and Plan :Danielle Hoffman is a 50 y.o. G1P1 admitted for dysfunctional uterine bleeding and symptomatic anemia secondary to fibroid uterus.  At this time pt has received IV premarin, transexamic acid and megace to decrease bleeding.  Per pt has bleeding has decreased substantially.  Recommend megace  At : 120 mg po daily x 5 days   80 mg po daily x 5 days   40 mg daily until she can be seen by an obstetrician gynecologist  The patient also appears to have received depo lupron 3.75 mg injection which will last x 1 month.  The patient may experience hot flashes and night sweats with this medication.  It may cause some shrinkage in the fibroids and amenorrhea while it is active.  Unfortunately, these are stop gap measures.  Due to the severe anemia she suffers from the bleeding, a hysterectomy would be recommended once her blood count is stable to prevent recurrence.  This may take several weeks or months since she will not take blood products secondary to religious beliefs. Route of hysterectomy has not been decided at this time.  A cell saver could possible be used at the time of procedure, but some improvement would need to be seen prior to surgery.   Thank you for this consult, we will sign off at this time. Please call or re-consult with further questions.  It is strongly recommend the patient be seen by an outpatient ob/gyn shortly after she is discharged.  We soul be happy to see her or she can be seen with the ob/gyn she has already established a relationship  with in [place].  I spent 55 minutes dedicated to the care of this patient including previsit review of records, face to face time with the patient discussing etiology of condition, ultrasound reports, and treatment options along with answering the patient's and her daughter's questions.    For OB/GYN questions, please call the [facility] [place] at [place] Monday - Friday, 8 am - 5 pm: [phone] All other times: [phone]    Lynnda Shields, M.D. Attending [place], [facility] for [employer], [place]

## 2020-11-17 NOTE — Care Plan (Signed)
This 50 yrs old female with PMH significant for dysfunctional uterine bleeding, iron deficiency anemia, fibroids presented in the ED with complaints of 1 day history of vaginal bleeding and generalized weakness.  Patient is visiting her daughter who just had a baby.  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  She has a hemoglobin as low as 5 g in March of this year, heme-onc at [facility] has treated her with IV iron infusion and erythropoietin.  Patient has not followed up with OB/GYN as recommended.  She was hypotensive on arrival , she was given IV fluids.  hemoglobin 8.5 which trended down to 6.9.  GYN was consulted,  recommended Megace 80 mg daily for 5 days followed by 40 mg daily.  Heme-onc consulted recommended checking for iron deficiency and start patient on iron supplements.  Patient was seen and examined she refuses blood transfusion.  Continue to monitor H&H.

## 2020-11-17 NOTE — ED Notes (Signed)
Patient is ready for admission to 1428

## 2020-11-18 DIAGNOSIS — D62 Acute posthemorrhagic anemia: Secondary | ICD-10-CM | POA: Diagnosis not present

## 2020-11-18 LAB — ERYTHROPOIETIN: Erythropoietin: 149 m[IU]/mL — ABNORMAL HIGH (ref 2.6–18.5)

## 2020-11-18 LAB — HEMOGLOBIN AND HEMATOCRIT, BLOOD
HCT: 17.2 % — ABNORMAL LOW (ref 36.0–46.0)
Hemoglobin: 5.5 g/dL — CL (ref 12.0–15.0)

## 2020-11-18 MED ORDER — MEGESTROL ACETATE 40 MG PO TABS: ORAL_TABLET | ORAL | 2 refills | Status: AC

## 2020-11-18 MED ORDER — SODIUM CHLORIDE 0.9 % IV SOLN
500.0000 mg | Freq: Once | INTRAVENOUS | Status: AC
  Administered 2020-11-18: 500 mg via INTRAVENOUS
  Filled 2020-11-18: qty 10

## 2020-11-18 MED ORDER — MEGESTROL ACETATE 40 MG PO TABS: ORAL_TABLET | ORAL | 2 refills | Status: DC

## 2020-11-18 MED ORDER — ADULT MULTIVITAMIN W/MINERALS CH: 1.0000 | ORAL_TABLET | Freq: Every day | ORAL | Status: DC

## 2020-11-18 MED ORDER — SODIUM CHLORIDE 0.9 % IV SOLN
25.0000 mg | Freq: Once | INTRAVENOUS | Status: AC
  Administered 2020-11-18: 25 mg via INTRAVENOUS
  Filled 2020-11-18: qty 0.5

## 2020-11-18 MED ORDER — PROSOURCE PLUS PO LIQD: 30.0000 mL | Freq: Two times a day (BID) | ORAL | Status: DC

## 2020-11-18 NOTE — Discharge Summary (Signed)
Physician Discharge Summary  Danielle Hoffman [id number] DOB: 1971-02-03 DOA: 11/16/2020  PCP: Pcp, No  Admit date: 11/16/2020 Discharge date: 11/18/2020  Admitted From: Home Disposition: Home  Recommendations for Outpatient Follow-up:  Follow up with PCP in 1-2 weeks Please obtain BMP/CBC in one week Please schedule follow-up with your oncology office for further iron transfusions as well as gynecologist for definitive treatment.  Home Health: Not applicable Equipment/Devices: Not applicable  Discharge Condition: Stable CODE STATUS: Full code Diet recommendation: Regular diet.  Discharge summary:  50 year old female with history of dysfunctional uterine bleeding, severe iron deficiency anemia and uterine fibroids who is also a Jehovah's Witness presents to the emergency room with menorrhagia as well generalized weakness and lightheadedness. Patient does have a history of chronic iron deficiency anemia with baseline hemoglobin over 6-7.5.  Declines blood products.  Followed up at [facility] oncology clinic and on iron transfusions but not received for the last few months. In the emergency room,Patient is 82/56.  Hemoglobin 8.5.  Admitted with hypotension and blood loss anemia.  Acute on chronic blood loss anemia, iron deficiency anemia due to fibroids and menorrhagia: Resuscitated with fluid.  Vaginal bleeding stopped. Hemoglobin 8.5-IV fluid resuscitation-6.2-5.5.  Symptoms improved. As patient is not receiving blood transfusions and blood products, she received a dose of tranexamic acid, 1 dose of Lupron injection and is started on Megace. Iron stores are low, was given 500 mg of IV iron today before discharge. Patient is asymptomatic.  Hemoglobin is 5.5, however she always have low hemoglobin. Offered her another dose of IV iron for tomorrow, however since patient had symptoms controlled she would like to go home and follow-up with her oncology clinic for further iron transfusions.  Continue  oral iron replacements.  Seen by OB/GYN, she will benefit with hysterectomy but needs to stabilize hemoglobin first.  She does have established OB/GYN provider and she is going to follow-up with them.  Gynecologist recommended to discharge patient on Megace 120 mg for 5 days, 80 mg for 5 days and will continue 40 mg until follow-up.  This was prescribed.  Discharge Diagnoses:  Principal Problem:   ABLA (acute blood loss anemia) Active Problems:   Dysfunctional uterine bleeding   Fibroids   Refusal of blood transfusions as patient is Jehovah's Witness   Vaginal bleeding    Discharge Instructions  Discharge Instructions     Call MD for:  difficulty breathing, headache or visual disturbances   Complete by: As directed    Call MD for:  extreme fatigue   Complete by: As directed    Call MD for:  persistant dizziness or light-headedness   Complete by: As directed    Diet general   Complete by: As directed    Discharge instructions   Complete by: As directed    Schedule follow up with your gynecologist , sooner if you experiencing more symptoms and bleeding episodes   Increase activity slowly   Complete by: As directed       Allergies as of 11/18/2020       Reactions   Aspirin Other (See Comments)   Doesn't take due to bleeding.   Hydrocodone Other (See Comments)   Per patient, makes her feel "high" and doesn't like it        Medication List     TAKE these medications    B-12 PO Take 1 tablet by mouth daily.   ferrous sulfate 325 (65 FE) MG tablet Take 325 mg by mouth daily.   ibuprofen  200 MG tablet Commonly known as: ADVIL Take 200 mg by mouth every 6 (six) hours as needed for moderate pain or mild pain.   megestrol 40 MG tablet Commonly known as: MEGACE 3 tabs for 5 days 2 tabs for 5 days then continue 1 tabs daily   VITAMIN C PO Take 1 tablet by mouth daily.        Allergies  Allergen Reactions   Aspirin Other (See Comments)    Doesn't take due  to bleeding.   Hydrocodone Other (See Comments)    Per patient, makes her feel "high" and doesn't like it    Consultations: OB/GYN   Procedures/Studies: DG Chest 1 View  Result Date: 11/17/2020 CLINICAL DATA:  Weakness and heavy vaginal bleeding. EXAM: CHEST  1 VIEW COMPARISON:  None. FINDINGS: The heart size and mediastinal contours are within normal limits. Both lungs are clear. The visualized skeletal structures are unremarkable. IMPRESSION: No active disease. Electronically Signed   By: Virgina Norfolk M.D.   On: 11/17/2020 02:10   US PELVIS (TRANSABDOMINAL ONLY)  Result Date: 11/17/2020 CLINICAL DATA:  Heavy vaginal bleeding. EXAM: TRANSABDOMINAL ULTRASOUND OF PELVIS TECHNIQUE: Transabdominal ultrasound examination of the pelvis was performed including evaluation of the uterus, ovaries, adnexal regions, and pelvic cul-de-sac. COMPARISON:  None. FINDINGS: Uterus Measurements: 16.3 cm x 10.1 cm x 12.7 cm = volume: 1084.8 mL. A 9.2 cm x 7.8 cm x 8.9 cm heterogeneous uterine fibroid is seen extending into the expected region of the endometrial canal. Additional 4.6 cm x 3.2 cm x 3.4 cm and 3.6 cm x 2.7 cm x 3.2 cm anterior uterine fibroids are seen. Endometrium Thickness: N/A.  Not clearly visualized. Right ovary Measurements: 3.3 cm x 2.5 cm x 2.7 cm = volume: 1.4 mL. A simple right ovarian cyst is seen. Left ovary Measurements: 1.5 cm x 0.8 cm x 1.6 cm = volume: 0.9 mL. Normal appearance/no adnexal mass. Other findings:  No abnormal free fluid. IMPRESSION: 1. Multiple large heterogeneous uterine fibroids. 2. Simple right ovarian cyst. Electronically Signed   By: Virgina Norfolk M.D.   On: 11/17/2020 02:14   (Echo, Carotid, EGD, Colonoscopy, ERCP)    Subjective: Patient seen and examined.  No overnight events.  No more dizziness lightheadedness.  Walking around. We discussed her hemoglobin findings, compared that with her on records from [facility].  She is agreeable for iron transfusion today  before discharge.  Her vaginal bleeding has stopped.   Discharge Exam: Vitals:   11/18/20 0137 11/18/20 0530  BP: 96/60 (!) 104/59  Pulse: (!) 107 96  Resp: 18 14  Temp: 98.3 F (36.8 C) 98.1 F (36.7 C)  SpO2: 100% 100%   Vitals:   11/17/20 2000 11/17/20 2144 11/18/20 0137 11/18/20 0530  BP:  (!) 111/54 96/60 (!) 104/59  Pulse:  (!) 110 (!) 107 96  Resp: '17 18 18 14  '$ Temp:  98.6 F (37 C) 98.3 F (36.8 C) 98.1 F (36.7 C)  TempSrc:  Oral Oral Oral  SpO2:  100% 100% 100%  Height:        General: Pt is alert, awake, not in acute distress Cardiovascular: RRR, S1/S2 +, no rubs, no gallops Respiratory: CTA bilaterally, no wheezing, no rhonchi Abdominal: Soft, NT, ND, bowel sounds + Extremities: no edema, no cyanosis    The results of significant diagnostics from this hospitalization (including imaging, microbiology, ancillary and laboratory) are listed below for reference.     Microbiology: Recent Results (from the past 240 hour(s))  Resp Panel by RT-PCR (Flu A&B, Covid) Nasopharyngeal Swab     Status: None   Collection Time: 11/17/20  3:58 AM   Specimen: Nasopharyngeal Swab; Nasopharyngeal(NP) swabs in vial transport medium  Result Value Ref Range Status   SARS Coronavirus 2 by RT PCR NEGATIVE NEGATIVE Final    Comment: (NOTE) SARS-CoV-2 target nucleic acids are NOT DETECTED.  The SARS-CoV-2 RNA is generally detectable in upper respiratory specimens during the acute phase of infection. The lowest concentration of SARS-CoV-2 viral copies this assay can detect is 138 copies/mL. A negative result does not preclude SARS-Cov-2 infection and should not be used as the sole basis for treatment or other patient management decisions. A negative result may occur with  improper specimen collection/handling, submission of specimen other than nasopharyngeal swab, presence of viral mutation(s) within the areas targeted by this assay, and inadequate number of viral copies(<138  copies/mL). A negative result must be combined with clinical observations, patient history, and epidemiological information. The expected result is Negative.  Fact Sheet for Patients:  EntrepreneurPulse.com.au  Fact Sheet for Healthcare Providers:  IncredibleEmployment.be  This test is no t yet approved or cleared by the [place] FDA and  has been authorized for detection and/or diagnosis of SARS-CoV-2 by FDA under an Emergency Use Authorization (EUA). This EUA will remain  in effect (meaning this test can be used) for the duration of the COVID-19 declaration under Section 564(b)(1) of the Act, 21 U.S.C.section 360bbb-3(b)(1), unless the authorization is terminated  or revoked sooner.       Influenza A by PCR NEGATIVE NEGATIVE Final   Influenza B by PCR NEGATIVE NEGATIVE Final    Comment: (NOTE) The Xpert Xpress SARS-CoV-2/FLU/RSV plus assay is intended as an aid in the diagnosis of influenza from Nasopharyngeal swab specimens and should not be used as a sole basis for treatment. Nasal washings and aspirates are unacceptable for Xpert Xpress SARS-CoV-2/FLU/RSV testing.  Fact Sheet for Patients: EntrepreneurPulse.com.au  Fact Sheet for Healthcare Providers: IncredibleEmployment.be  This test is not yet approved or cleared by the [place] FDA and has been authorized for detection and/or diagnosis of SARS-CoV-2 by FDA under an Emergency Use Authorization (EUA). This EUA will remain in effect (meaning this test can be used) for the duration of the COVID-19 declaration under Section 564(b)(1) of the Act, 21 U.S.C. section 360bbb-3(b)(1), unless the authorization is terminated or revoked.  Performed at [facility], [place] [address] [place], [place] [address]      Labs: BNP (last 3 results) No results for input(s): BNP in the last 8760 hours. Basic Metabolic Panel: Recent  Labs  Lab 11/17/20 0200 11/17/20 0209 11/17/20 0648  NA 139 139 138  K 3.7 3.7 3.6  CL 107 104 110  CO2 24  --  21*  GLUCOSE 124* 119* 105*  BUN '9 7 8  '$ CREATININE 0.82 0.80 0.54  CALCIUM 8.0*  --  7.3*   Liver Function Tests: Recent Labs  Lab 11/17/20 0200  AST 15  ALT 12  ALKPHOS 35*  BILITOT 0.4  PROT 6.4*  ALBUMIN 3.4*   No results for input(s): LIPASE, AMYLASE in the last 168 hours. No results for input(s): AMMONIA in the last 168 hours. CBC: Recent Labs  Lab 11/17/20 0200 11/17/20 0209 11/17/20 0800 11/17/20 1606 11/18/20 0730  WBC 11.7*  --   --   --   --   NEUTROABS 10.1*  --   --   --   --  HGB 8.8* 9.5* 6.9* 6.2* 5.5*  HCT 28.1* 28.0* 21.6* 19.9* 17.2*  MCV 97.6  --   --   --   --   PLT 347  --   --   --   --    Cardiac Enzymes: No results for input(s): CKTOTAL, CKMB, CKMBINDEX, TROPONINI in the last 168 hours. BNP: Invalid input(s): POCBNP CBG: No results for input(s): GLUCAP in the last 168 hours. D-Dimer No results for input(s): DDIMER in the last 72 hours. Hgb A1c No results for input(s): HGBA1C in the last 72 hours. Lipid Profile No results for input(s): CHOL, HDL, LDLCALC, TRIG, CHOLHDL, LDLDIRECT in the last 72 hours. Thyroid function studies No results for input(s): TSH, T4TOTAL, T3FREE, THYROIDAB in the last 72 hours.  Invalid input(s): FREET3 Anemia work up [employer]    11/17/20 0648 11/17/20 1606  FERRITIN  --  8*  TIBC 288  --   IRON 13*  --   RETICCTPCT 2.0  --    Urinalysis No results found for: COLORURINE, APPEARANCEUR, LABSPEC, [place], GLUCOSEU, HGBUR, BILIRUBINUR, KETONESUR, PROTEINUR, UROBILINOGEN, NITRITE, LEUKOCYTESUR Sepsis Labs Invalid input(s): PROCALCITONIN,  WBC,  LACTICIDVEN Microbiology Recent Results (from the past 240 hour(s))  Resp Panel by RT-PCR (Flu A&B, Covid) Nasopharyngeal Swab     Status: None   Collection Time: 11/17/20  3:58 AM   Specimen: Nasopharyngeal Swab; Nasopharyngeal(NP) swabs in  vial transport medium  Result Value Ref Range Status   SARS Coronavirus 2 by RT PCR NEGATIVE NEGATIVE Final    Comment: (NOTE) SARS-CoV-2 target nucleic acids are NOT DETECTED.  The SARS-CoV-2 RNA is generally detectable in upper respiratory specimens during the acute phase of infection. The lowest concentration of SARS-CoV-2 viral copies this assay can detect is 138 copies/mL. A negative result does not preclude SARS-Cov-2 infection and should not be used as the sole basis for treatment or other patient management decisions. A negative result may occur with  improper specimen collection/handling, submission of specimen other than nasopharyngeal swab, presence of viral mutation(s) within the areas targeted by this assay, and inadequate number of viral copies(<138 copies/mL). A negative result must be combined with clinical observations, patient history, and epidemiological information. The expected result is Negative.  Fact Sheet for Patients:  EntrepreneurPulse.com.au  Fact Sheet for Healthcare Providers:  IncredibleEmployment.be  This test is no t yet approved or cleared by the [place] FDA and  has been authorized for detection and/or diagnosis of SARS-CoV-2 by FDA under an Emergency Use Authorization (EUA). This EUA will remain  in effect (meaning this test can be used) for the duration of the COVID-19 declaration under Section 564(b)(1) of the Act, 21 U.S.C.section 360bbb-3(b)(1), unless the authorization is terminated  or revoked sooner.       Influenza A by PCR NEGATIVE NEGATIVE Final   Influenza B by PCR NEGATIVE NEGATIVE Final    Comment: (NOTE) The Xpert Xpress SARS-CoV-2/FLU/RSV plus assay is intended as an aid in the diagnosis of influenza from Nasopharyngeal swab specimens and should not be used as a sole basis for treatment. Nasal washings and aspirates are unacceptable for Xpert Xpress SARS-CoV-2/FLU/RSV testing.  Fact  Sheet for Patients: EntrepreneurPulse.com.au  Fact Sheet for Healthcare Providers: IncredibleEmployment.be  This test is not yet approved or cleared by the [place] FDA and has been authorized for detection and/or diagnosis of SARS-CoV-2 by FDA under an Emergency Use Authorization (EUA). This EUA will remain in effect (meaning this test can be used) for the duration of the  COVID-19 declaration under Section 564(b)(1) of the Act, 21 U.S.C. section 360bbb-3(b)(1), unless the authorization is terminated or revoked.  Performed at [facility], [place] [address] [place], [place] [address]      Time coordinating discharge:  32 minutes  SIGNED:   Barb Merino, MD  [facility] 11/18/2020, 1:47 PM

## 2020-11-18 NOTE — Progress Notes (Signed)
Initial Nutrition Assessment  DOCUMENTATION CODES:   Not applicable  INTERVENTION:  - will order 30 ml Prosource Plus BID, each supplement provides 100 kcal and 15 grams protein.  - will order 1 tablet multivitamin with minerals/day.   NUTRITION DIAGNOSIS:   Inadequate oral intake related to acute illness as evidenced by per patient/family report.  GOAL:   Patient will meet greater than or equal to 90% of their needs  MONITOR:   PO intake, Supplement acceptance, Labs, Weight trends  REASON FOR ASSESSMENT:   Malnutrition Screening Tool  ASSESSMENT:   50 y.o. female with medical history of dysfunctional uterine bleeding, iron deficiency anemia, and fibroids. She is a Restaurant manager, fast food. She has been treated by Heme Onc at [facility] with IV iron and erythropoietin. She has not followed up with OB/GYN as recommended. She presented to the ED with report of 1-day hx of vaginal bleeding and weakness.  Patient has not been weighed since admission. The most recently documented weights were 148 lb on 06/25/20 at [facility] and 146 lb on 01/10/20 at [facility]. Patient reported losing 20-30 lb in the past 6 months which does not align with limited weight information available as this would indicate weight loss began in 04/2020 but in 06/2020 weight had been stable since 12/2019.  No intakes documented since admission. OB/GYN saw patient yesterday and signed off. Discharge order for d/c home entered a short time ago. Discharge summary not yet entered.    Labs reviewed; Ca: 7.3 mg/dl. Medications reviewed; 120 mg megace/day. IVF; LR @ 100 ml/hr.     NUTRITION - FOCUSED PHYSICAL EXAM:  Unable to complete at this time.  Diet Order:   Diet Order             Diet general           Diet regular Room service appropriate? Yes; Fluid consistency: Thin  Diet effective now                   EDUCATION NEEDS:   No education needs have been identified at this time  Skin:  Skin Assessment: Reviewed RN  Assessment  Last BM:  PTA/unknown  Height:   Ht Readings from Last 1 Encounters:  11/16/20 '5\' 5"'$  (1.651 m)    Weight:   Wt Readings from Last 1 Encounters:  No data found for Wt      Estimated Nutritional Needs:  Kcal:  [id number] kcal Protein:  80-95 grams Fluid:  >/= 1.8 L/day      Jarome Matin, MS, RD, LDN, CNSC Inpatient Clinical Dietitian RD pager # available in [facility]  After hours/weekend pager # available in [facility]

## 2022-09-25 IMAGING — DX DG CHEST 1V
1 series · 1 of 1 positions shown · non-contrast
Comparison: None.

CLINICAL DATA: Weakness and heavy vaginal bleeding.

EXAM:
CHEST  1 VIEW

[chest ap]
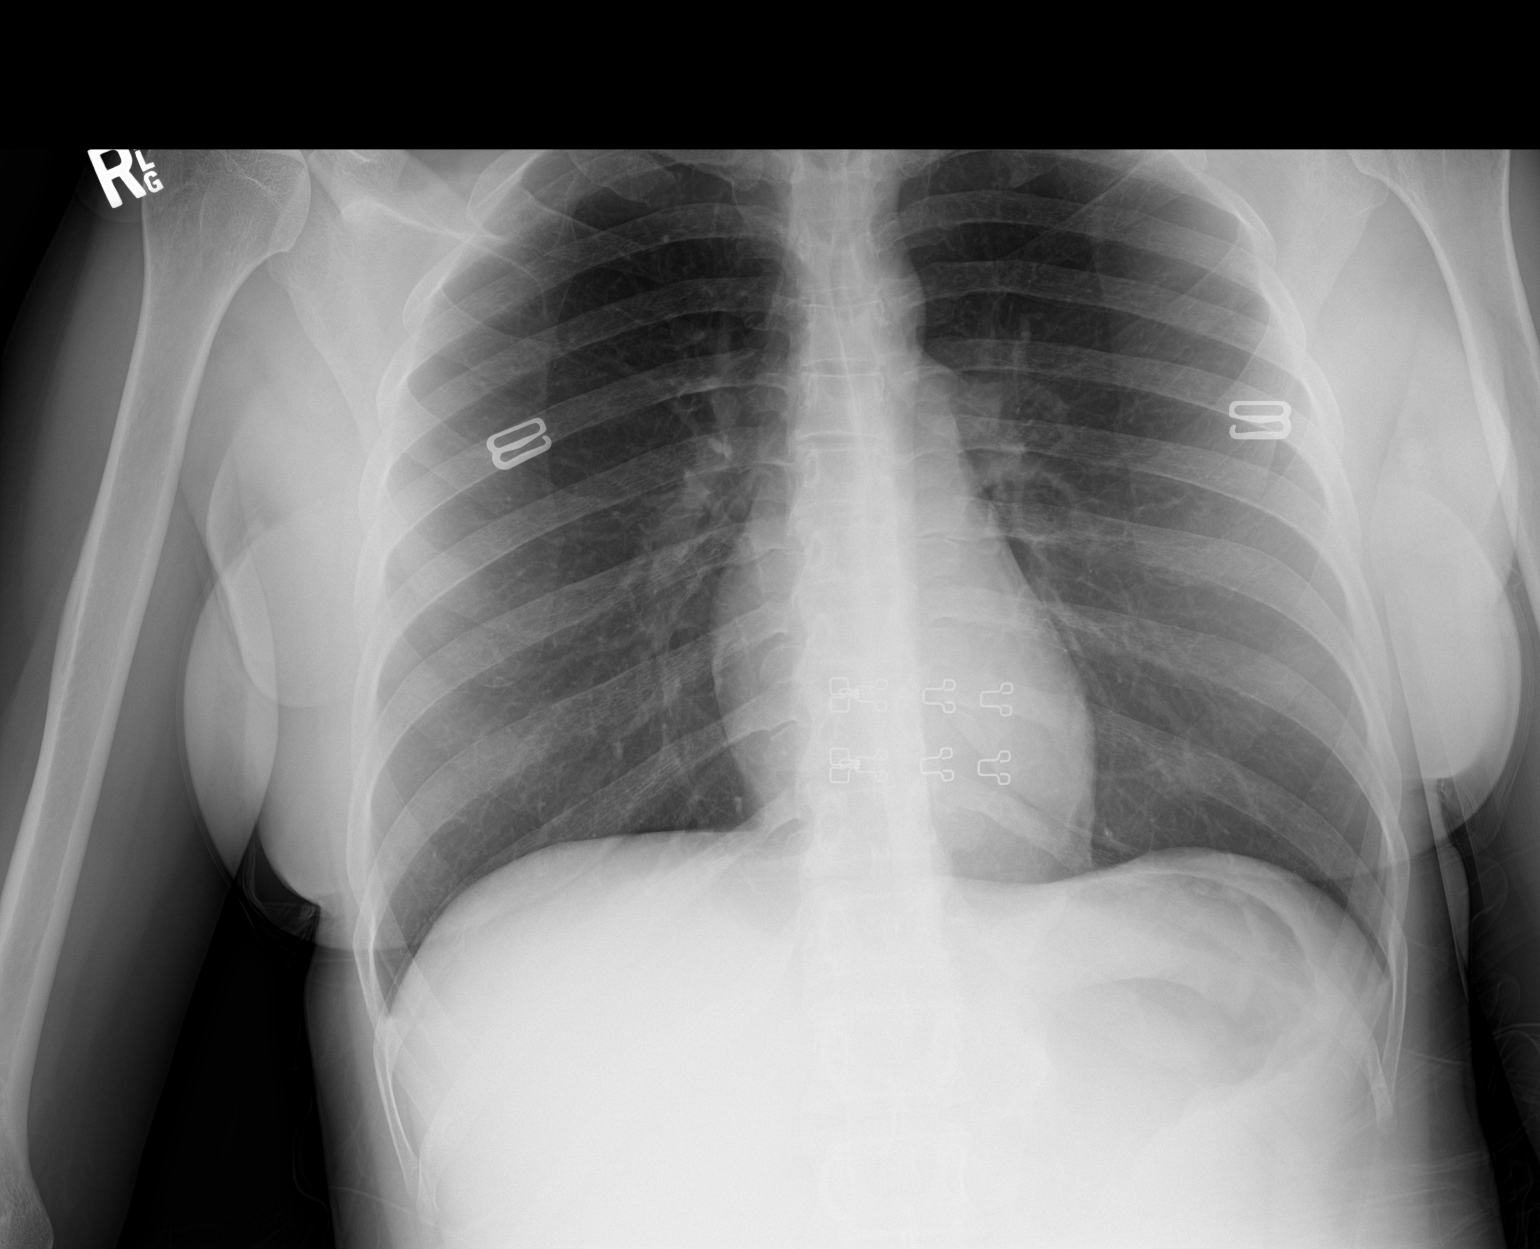

[1 of 1 positions shown; findings below may reference images not displayed]

FINDINGS: The heart size and mediastinal contours are within normal limits.
Both lungs are clear. The visualized skeletal structures are
unremarkable.
IMPRESSION: No active disease.

## 2022-09-25 IMAGING — US US PELVIS COMPLETE
1 series · 15 of 25 positions shown · non-contrast
Comparison: None.

CLINICAL DATA: Heavy vaginal bleeding.

EXAM:
TRANSABDOMINAL ULTRASOUND OF PELVIS
TECHNIQUE: Transabdominal ultrasound examination of the pelvis was performed
including evaluation of the uterus, ovaries, adnexal regions, and
pelvic cul-de-sac.

[Series 1: us abdomen complete mc & wl · 15 of 73 slices shown]
[im 1/73]
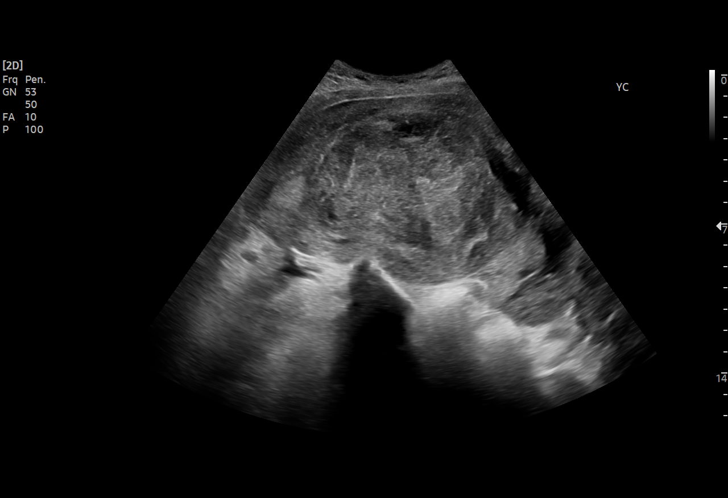
[im 7/73]
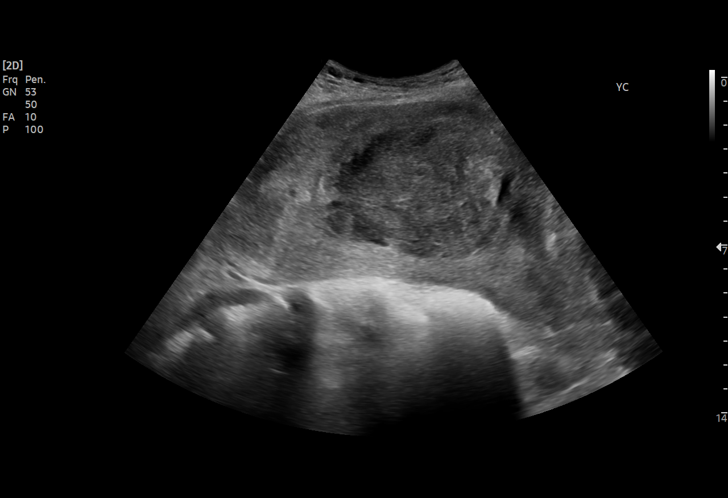
[im 13/73]
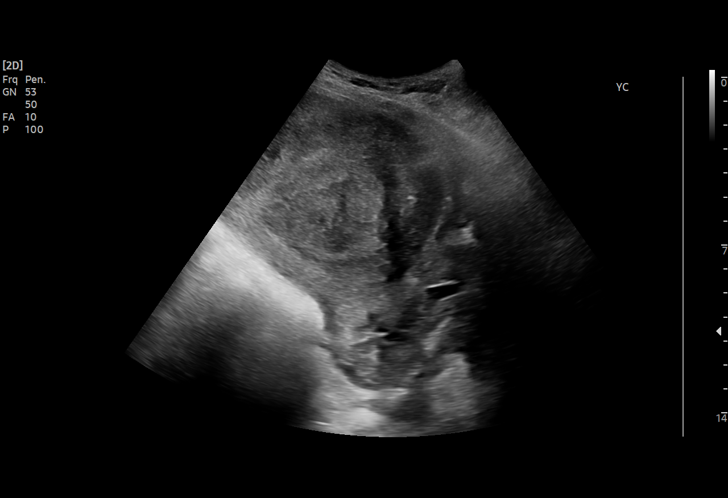
[im 16/73]
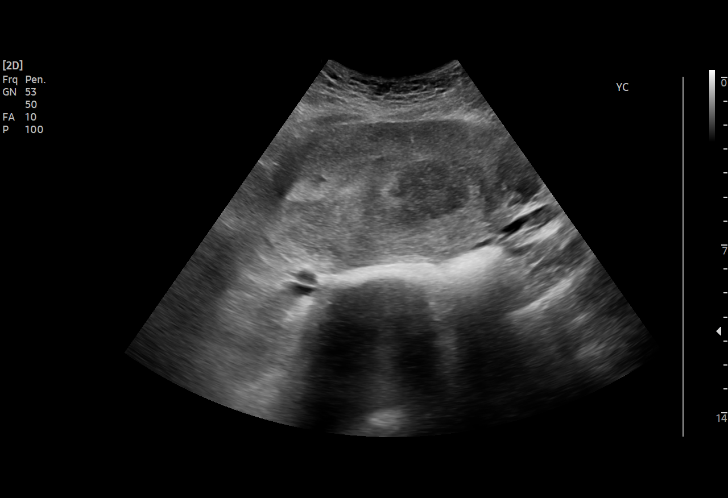
[im 22/73]
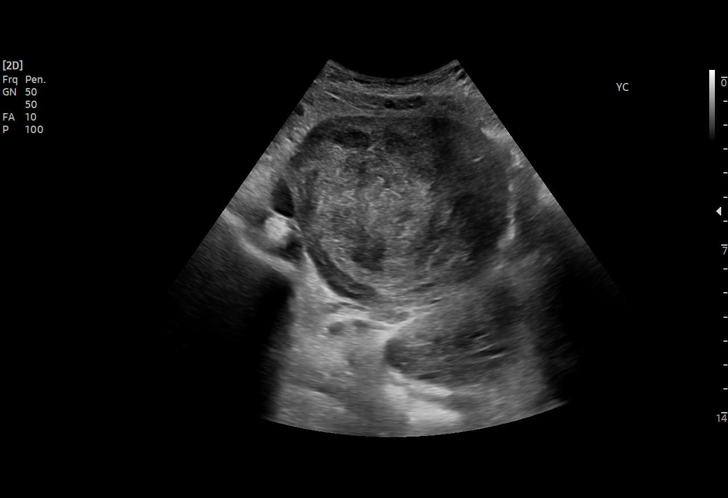
[im 28/73]
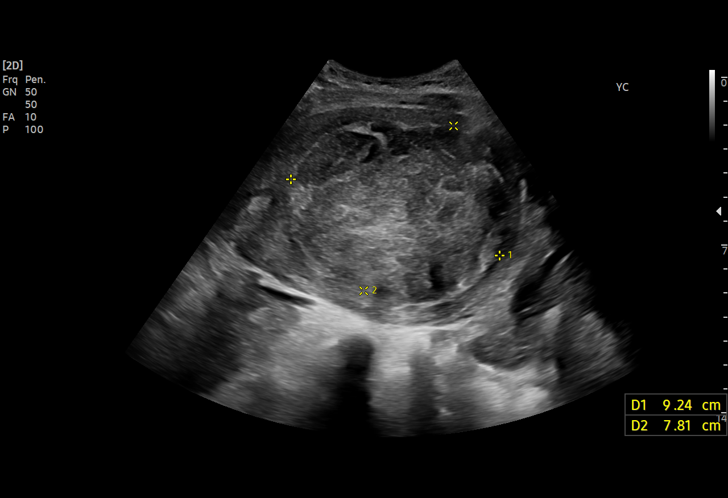
[im 31/73]
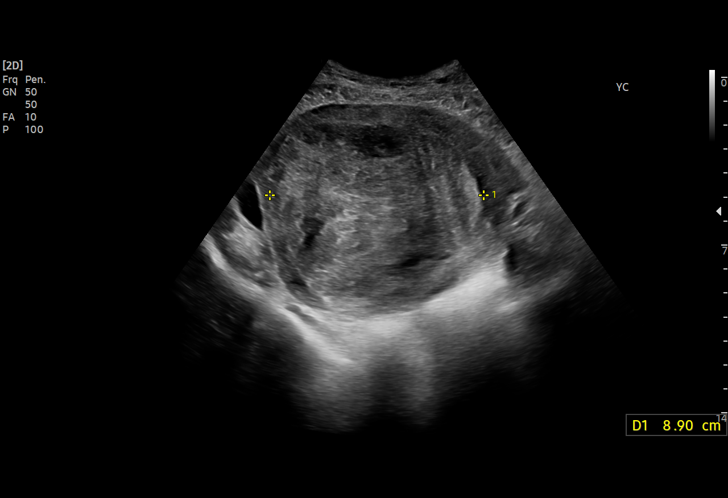
[im 37/73]
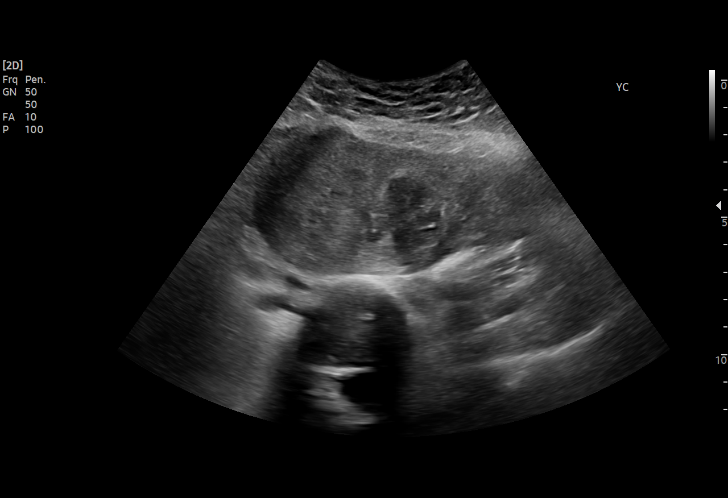
[im 43/73]
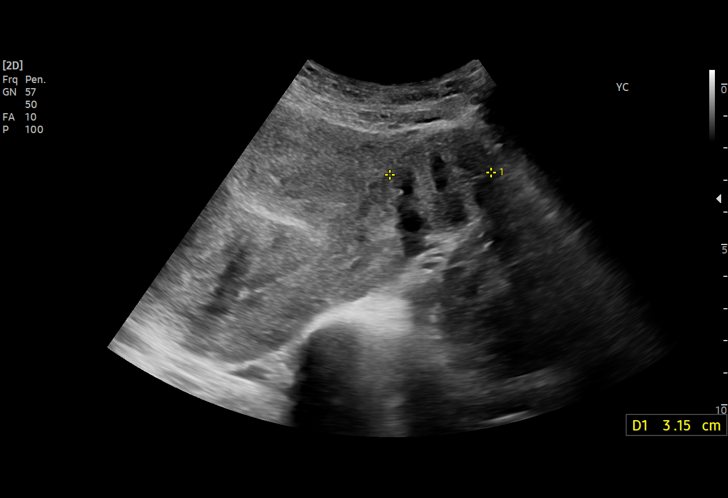
[im 46/73]
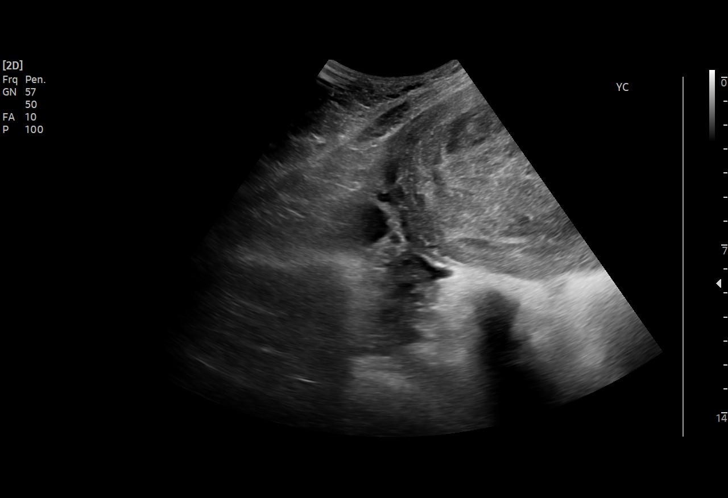
[im 52/73]
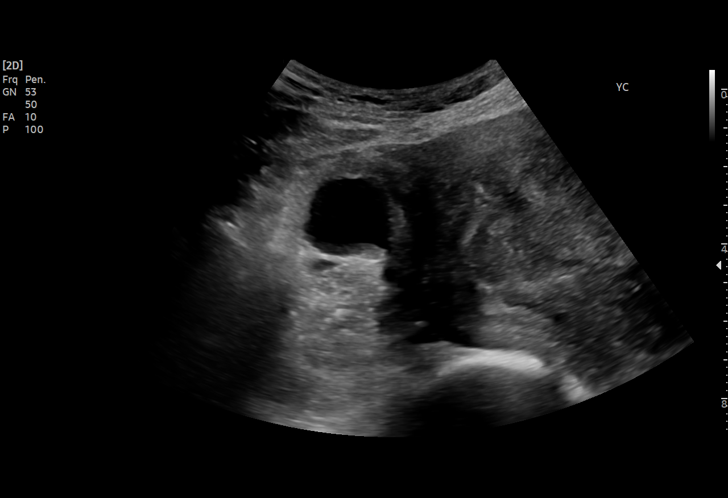
[im 58/73]
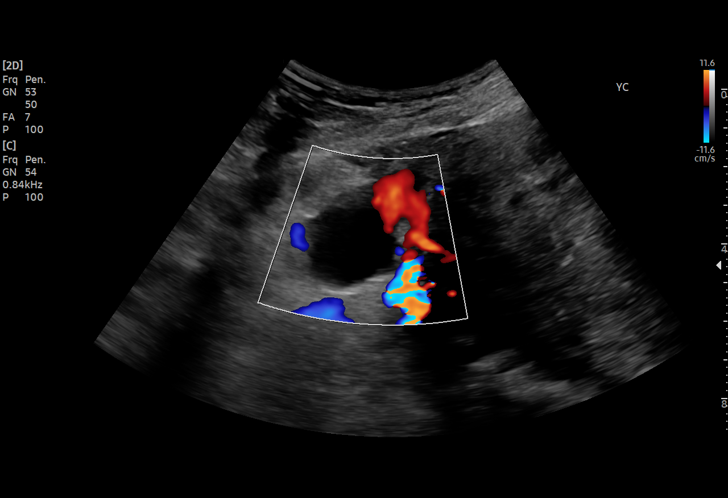
[im 61/73]
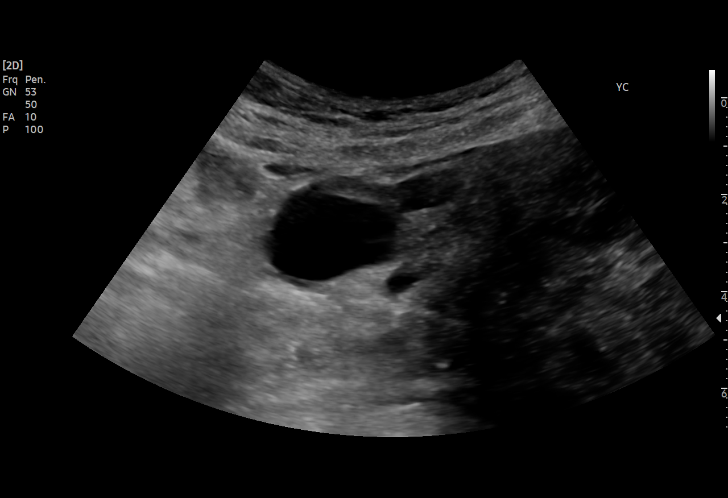
[im 67/73]
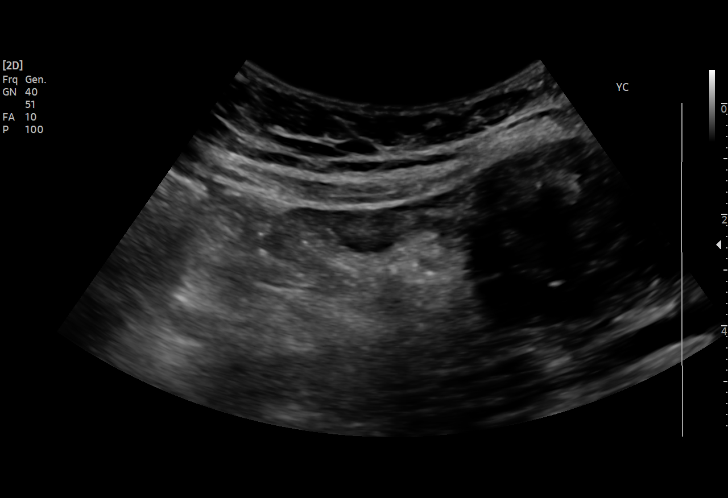
[im 73/73]
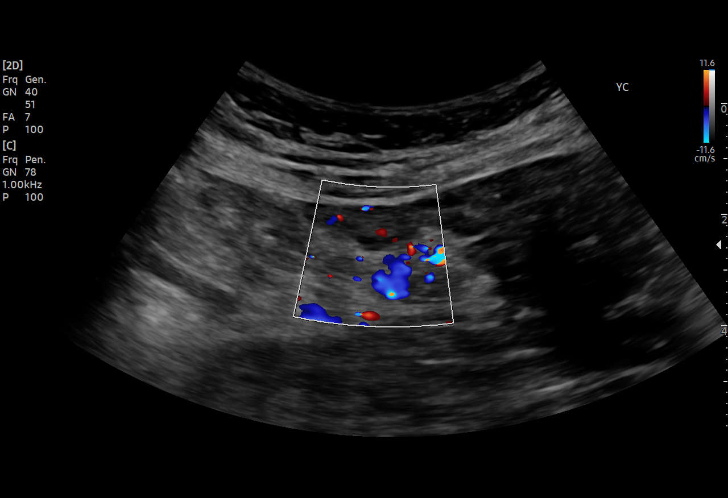

[15 of 25 positions shown; findings below may reference images not displayed]

FINDINGS: Uterus

Measurements: 16.3 cm x 10.1 cm x 12.7 cm = volume: 1084.8 mL. A
cm x 7.8 cm x 8.9 cm heterogeneous uterine fibroid is seen extending
into the expected region of the endometrial canal. Additional 4.6 cm
x 3.2 cm x 3.4 cm and 3.6 cm x 2.7 cm x 3.2 cm anterior uterine
fibroids are seen.

Endometrium

Thickness: N/A.  Not clearly visualized.

Right ovary

Measurements: 3.3 cm x 2.5 cm x 2.7 cm = volume: 1.4 mL. A simple
right ovarian cyst is seen.

Left ovary

Measurements: 1.5 cm x 0.8 cm x 1.6 cm = volume: 0.9 mL. Normal
appearance/no adnexal mass.

Other findings:  No abnormal free fluid.
IMPRESSION: 1. Multiple large heterogeneous uterine fibroids.
2. Simple right ovarian cyst.
